# Patient Record
Sex: Female | Born: 1940 | Race: Black or African American | Hispanic: No | Marital: Married | Smoking: Never smoker
Health system: Southern US, Community
[De-identification: ages and names within clinical notes are randomized; demographics above are authoritative.]

## PROBLEM LIST (undated history)

## (undated) DIAGNOSIS — E119 Type 2 diabetes mellitus without complications: Secondary | ICD-10-CM

## (undated) DIAGNOSIS — I10 Essential (primary) hypertension: Secondary | ICD-10-CM

---

## 2013-07-04 ENCOUNTER — Other Ambulatory Visit: Payer: Self-pay | Admitting: Orthopedic Surgery

## 2013-07-04 DIAGNOSIS — M25512 Pain in left shoulder: Secondary | ICD-10-CM

## 2013-07-06 ENCOUNTER — Other Ambulatory Visit: Payer: Self-pay

## 2013-07-14 ENCOUNTER — Other Ambulatory Visit: Payer: Self-pay

## 2020-10-12 ENCOUNTER — Emergency Department (HOSPITAL_BASED_OUTPATIENT_CLINIC_OR_DEPARTMENT_OTHER): Payer: Medicare Other

## 2020-10-12 ENCOUNTER — Emergency Department (HOSPITAL_BASED_OUTPATIENT_CLINIC_OR_DEPARTMENT_OTHER)
Admission: EM | Admit: 2020-10-12 | Discharge: 2020-10-12 | Disposition: A | Discharge status: Home / Self Care | Payer: Medicare Other | Attending: Emergency Medicine | Admitting: Emergency Medicine

## 2020-10-12 ENCOUNTER — Encounter (HOSPITAL_BASED_OUTPATIENT_CLINIC_OR_DEPARTMENT_OTHER): Payer: Self-pay

## 2020-10-12 ENCOUNTER — Other Ambulatory Visit: Payer: Self-pay

## 2020-10-12 DIAGNOSIS — E119 Type 2 diabetes mellitus without complications: Secondary | ICD-10-CM | POA: Insufficient documentation

## 2020-10-12 DIAGNOSIS — I10 Essential (primary) hypertension: Secondary | ICD-10-CM | POA: Diagnosis not present

## 2020-10-12 DIAGNOSIS — S4991XA Unspecified injury of right shoulder and upper arm, initial encounter: Secondary | ICD-10-CM | POA: Diagnosis present

## 2020-10-12 DIAGNOSIS — R079 Chest pain, unspecified: Secondary | ICD-10-CM | POA: Diagnosis not present

## 2020-10-12 DIAGNOSIS — S43401A Unspecified sprain of right shoulder joint, initial encounter: Secondary | ICD-10-CM | POA: Diagnosis not present

## 2020-10-12 DIAGNOSIS — M25511 Pain in right shoulder: Secondary | ICD-10-CM | POA: Insufficient documentation

## 2020-10-12 DIAGNOSIS — Y92410 Unspecified street and highway as the place of occurrence of the external cause: Secondary | ICD-10-CM | POA: Insufficient documentation

## 2020-10-12 HISTORY — DX: Type 2 diabetes mellitus without complications: E11.9

## 2020-10-12 HISTORY — DX: Essential (primary) hypertension: I10

## 2020-10-12 MED ORDER — METHOCARBAMOL 500 MG PO TABS: 500.0000 mg | ORAL_TABLET | Freq: Three times a day (TID) | ORAL | 0 refills | Status: DC | PRN
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
## 2020-10-12 NOTE — ED Notes (Signed)
ED Provider at bedside. 

## 2021-04-02 ENCOUNTER — Encounter (HOSPITAL_BASED_OUTPATIENT_CLINIC_OR_DEPARTMENT_OTHER): Payer: Self-pay

## 2021-04-02 ENCOUNTER — Other Ambulatory Visit: Payer: Self-pay

## 2021-04-02 ENCOUNTER — Emergency Department (HOSPITAL_BASED_OUTPATIENT_CLINIC_OR_DEPARTMENT_OTHER)
Admission: EM | Admit: 2021-04-02 | Discharge: 2021-04-02 | Disposition: A | Discharge status: Home / Self Care | Payer: Medicare Other | Attending: Emergency Medicine | Admitting: Emergency Medicine

## 2021-04-02 DIAGNOSIS — E119 Type 2 diabetes mellitus without complications: Secondary | ICD-10-CM | POA: Diagnosis not present

## 2021-04-02 DIAGNOSIS — M542 Cervicalgia: Secondary | ICD-10-CM | POA: Diagnosis present

## 2021-04-02 DIAGNOSIS — I10 Essential (primary) hypertension: Secondary | ICD-10-CM | POA: Insufficient documentation

## 2021-04-02 MED ORDER — CELECOXIB 200 MG PO CAPS: ORAL_CAPSULE | ORAL | 0 refills | Status: AC
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
## 2021-04-02 NOTE — ED Triage Notes (Signed)
Pt c/o bilateral neck pain x 3 days, denies any other symptoms.

## 2021-04-17 ENCOUNTER — Emergency Department (HOSPITAL_BASED_OUTPATIENT_CLINIC_OR_DEPARTMENT_OTHER)
Admission: EM | Admit: 2021-04-17 | Discharge: 2021-04-17 | Disposition: A | Discharge status: Home / Self Care | Payer: Medicare Other | Attending: Emergency Medicine | Admitting: Emergency Medicine

## 2021-04-17 ENCOUNTER — Encounter (HOSPITAL_BASED_OUTPATIENT_CLINIC_OR_DEPARTMENT_OTHER): Payer: Self-pay | Admitting: Emergency Medicine

## 2021-04-17 ENCOUNTER — Emergency Department (HOSPITAL_BASED_OUTPATIENT_CLINIC_OR_DEPARTMENT_OTHER): Payer: Medicare Other

## 2021-04-17 DIAGNOSIS — Z23 Encounter for immunization: Secondary | ICD-10-CM | POA: Insufficient documentation

## 2021-04-17 DIAGNOSIS — W01198A Fall on same level from slipping, tripping and stumbling with subsequent striking against other object, initial encounter: Secondary | ICD-10-CM | POA: Insufficient documentation

## 2021-04-17 DIAGNOSIS — I10 Essential (primary) hypertension: Secondary | ICD-10-CM | POA: Diagnosis not present

## 2021-04-17 DIAGNOSIS — S0181XA Laceration without foreign body of other part of head, initial encounter: Secondary | ICD-10-CM

## 2021-04-17 DIAGNOSIS — S0990XA Unspecified injury of head, initial encounter: Secondary | ICD-10-CM | POA: Diagnosis present

## 2021-04-17 DIAGNOSIS — E119 Type 2 diabetes mellitus without complications: Secondary | ICD-10-CM | POA: Diagnosis not present

## 2021-04-17 MED ORDER — TETANUS-DIPHTH-ACELL PERTUSSIS 5-2.5-18.5 LF-MCG/0.5 IM SUSY
0.5000 mL | PREFILLED_SYRINGE | Freq: Once | INTRAMUSCULAR | Status: AC
  Administered 2021-04-17: 0.5 mL via INTRAMUSCULAR
  Filled 2021-04-17: qty 0.5

## 2021-04-17 NOTE — ED Provider Notes (Signed)
[facility] EMERGENCY DEPARTMENT Provider Note   CSN: [id number] Arrival date & time: 04/17/21  1346     History  Chief Complaint  Patient presents with   Head Laceration    Destiny Castillo is a 81 y.o. female.   Head Laceration   Patient with medical history notable for hypertension and type 2 diabetes presents due to fall.  2 hours prior to arrival patient was walking and tripped over a box hitting her head.  She has superficial laceration over her left eyebrow.  She did not lose consciousness, has not had any headache, vision changes, nausea, vomiting.  Not on any blood thinners.   Home Medications Prior to Admission medications   Medication Sig Start Date End Date Taking? Authorizing Provider  celecoxib (CELEBREX) 200 MG capsule Take 1 tablet twice daily as needed for pain. 04/02/21   Molpus, John, MD      Allergies    Codeine    Review of Systems   Review of Systems  Skin:  Positive for wound.   Physical Exam Updated Vital Signs BP (!) 182/69 (BP Location: Left Arm)    Pulse 69    Temp 98.5 F (36.9 C) (Oral)    Resp (!) 22    SpO2 95%  Physical Exam Vitals and nursing note reviewed. Exam conducted with a chaperone present.  Constitutional:      Appearance: Normal appearance.  HENT:     Head: Normocephalic.     Right Ear: Tympanic membrane normal.     Left Ear: Tympanic membrane normal.     Ears:     Comments: No hemotympanum. Negative battle sign.     Nose: Nose normal. No rhinorrhea.     Comments: No septal hematoma. No nasal crepitus.     Mouth/Throat:     Mouth: Mucous membranes are moist.     Pharynx: Oropharynx is clear.     Comments: No malocclusion or dental fractures. No dental avulsions appreciated.  Eyes:     Extraocular Movements: Extraocular movements intact.     Pupils: Pupils are equal, round, and reactive to light.     Comments: No tear drop pupil or pain with EOMs.  Neck:     Comments: C-spine tenderness but not midline, no  palpable step-offs. Cardiovascular:     Rate and Rhythm: Normal rate and regular rhythm.  Musculoskeletal:     Cervical back: Normal range of motion. Tenderness present.     Comments: Moving all extremities   Neurological:     General: No focal deficit present.     Mental Status: She is alert and oriented to person, place, and time.     Comments: No facial numbness. The patient is alert and oriented to person, place, and time with normal speech. Cranial nerves III-XII are grossly intact.   Psychiatric:        Mood and Affect: Mood normal.    ED Results / Procedures / Treatments   Labs (all labs ordered are listed, but only abnormal results are displayed) Labs Reviewed - No data to display  EKG None  Radiology CT Head Wo Contrast  Result Date: 04/17/2021 CLINICAL DATA:  Head trauma, minor (Age >= 65y); Neck trauma (Age >= 65y) EXAM: CT HEAD WITHOUT CONTRAST CT CERVICAL SPINE WITHOUT CONTRAST TECHNIQUE: Multidetector CT imaging of the head and cervical spine was performed following the standard protocol without intravenous contrast. Multiplanar CT image reconstructions of the cervical spine were also generated. RADIATION DOSE REDUCTION: This exam  was performed according to the departmental dose-optimization program which includes automated exposure control, adjustment of the mA and/or kV according to patient size and/or use of iterative reconstruction technique. COMPARISON:  Head CT 10/10/2020 FINDINGS: CT HEAD FINDINGS Brain: No evidence of acute intracranial hemorrhage or extra-axial collection.No evidence of mass lesion/concern mass effect.The ventricles are normal in size.Scattered subcortical and periventricular white matter hypodensities, nonspecific but likely sequela of chronic small vessel ischemic disease. Vascular: No hyperdense vessel or unexpected calcification. Skull: Normal. Negative for fracture or focal lesion. Sinuses/Orbits: No acute finding. Other: Small laceration along  the left eyebrow. CT CERVICAL SPINE FINDINGS Alignment: Trace degenerative anterolisthesis at C3-C4 and C4-C5. Skull base and vertebrae: No acute fracture. No primary bone lesion or focal pathologic process. Soft tissues and spinal canal: No prevertebral fluid or swelling. No visible canal hematoma. Disc levels: There is multilevel degenerative disc disease, overall mild to moderate, worst at C5-C6 and C6-C7 and C7-T1. There is moderate multilevel facet arthropathy, with bony fusion at C2-C3 and C3-C4. Upper chest: Negative. Other: None. IMPRESSION: No acute intracranial abnormality. Unchanged mild sequela of chronic small vessel ischemic disease. No acute cervical spine fracture. Multilevel degenerative disc disease and facet arthropathy. Electronically Signed   By: Caprice Renshaw M.D.   On: 04/17/2021 16:09   CT Cervical Spine Wo Contrast  Result Date: 04/17/2021 CLINICAL DATA:  Head trauma, minor (Age >= 65y); Neck trauma (Age >= 65y) EXAM: CT HEAD WITHOUT CONTRAST CT CERVICAL SPINE WITHOUT CONTRAST TECHNIQUE: Multidetector CT imaging of the head and cervical spine was performed following the standard protocol without intravenous contrast. Multiplanar CT image reconstructions of the cervical spine were also generated. RADIATION DOSE REDUCTION: This exam was performed according to the departmental dose-optimization program which includes automated exposure control, adjustment of the mA and/or kV according to patient size and/or use of iterative reconstruction technique. COMPARISON:  Head CT 10/10/2020 FINDINGS: CT HEAD FINDINGS Brain: No evidence of acute intracranial hemorrhage or extra-axial collection.No evidence of mass lesion/concern mass effect.The ventricles are normal in size.Scattered subcortical and periventricular white matter hypodensities, nonspecific but likely sequela of chronic small vessel ischemic disease. Vascular: No hyperdense vessel or unexpected calcification. Skull: Normal. Negative for  fracture or focal lesion. Sinuses/Orbits: No acute finding. Other: Small laceration along the left eyebrow. CT CERVICAL SPINE FINDINGS Alignment: Trace degenerative anterolisthesis at C3-C4 and C4-C5. Skull base and vertebrae: No acute fracture. No primary bone lesion or focal pathologic process. Soft tissues and spinal canal: No prevertebral fluid or swelling. No visible canal hematoma. Disc levels: There is multilevel degenerative disc disease, overall mild to moderate, worst at C5-C6 and C6-C7 and C7-T1. There is moderate multilevel facet arthropathy, with bony fusion at C2-C3 and C3-C4. Upper chest: Negative. Other: None. IMPRESSION: No acute intracranial abnormality. Unchanged mild sequela of chronic small vessel ischemic disease. No acute cervical spine fracture. Multilevel degenerative disc disease and facet arthropathy. Electronically Signed   By: Caprice Renshaw M.D.   On: 04/17/2021 16:09    Procedures Procedures    Medications Ordered in ED Medications  Tdap (BOOSTRIX) injection 0.5 mL (0.5 mLs Intramuscular Given 04/17/21 1552)    ED Course/ Medical Decision Making/ A&P                           Medical Decision Making Amount and/or Complexity of Data Reviewed Radiology: ordered.  Risk Prescription drug management.   Patient presents with forehead laceration.  Differential diagnosis includes but is not  limited to forehead laceration, intracranial bleed, C-spine injury, basilar skull fracture.   She is not anticoagulated and has no focal deficits on exam.  GCS 15.  No findings that would support basilar skull fracture.  I reviewed patient's chart, her next test is tetanus booster scheduled for November of this year, will update today in the ED.  Discussed laceration repair with sutures versus Dermabond. Patient states she does not want needles and is not worried about the cosmetic result.  I believe it is reasonable to proceed with Dermabond repair.  CT head ordered given patient's  risk for more severe brain injuries secondary to age.  I reviewed the imaging and independently interpreted there were no signs of intracranial hemorrhage or skull fracture.  Considered CT of cervical spine but based on [place] C-spine rule do not think necessary.  Patient is in agreement.  Reevaluation patient is unchanged, reports feeling well.   Dx - forehead laceration   I feel she is appropriate for discharge home and outpatient follow-up as needed.  No indications for antibiotics.         Final Clinical Impression(s) / ED Diagnoses Final diagnoses:  Laceration of forehead, initial encounter    Rx / DC Orders ED Discharge Orders     None         Theron Arista, PA-C 04/17/21 2352    Charlynne Pander, MD 04/18/21 [phone]

## 2021-04-17 NOTE — ED Triage Notes (Addendum)
Pt report she tripped over a box this afternoon and hit her head. Pt has 1" laceration over L eyebrow. No bleeding at present. Not on blood thinners. No LOC.

## 2021-04-17 NOTE — Discharge Instructions (Signed)
The CT imaging of your head and neck look normal.  Take Tylenol as needed for pain at home.  The Dermabond will dissolve on its own, keep dry for the next 24 hours.  Your tetanus was updated in the ED today, you will not need booster for 7 to 10 years.  Please let your primary know as you are currently scheduled for update in November of this year.

## 2021-04-17 NOTE — ED Notes (Signed)
Client alert and oriented, follows commands without hesitation, no changes in LOC at this time. Laceration at left eye lid noted, no active bleeding is noted. Tetanus IM administered. Pt teaching provided

## 2021-04-25 ENCOUNTER — Emergency Department (HOSPITAL_BASED_OUTPATIENT_CLINIC_OR_DEPARTMENT_OTHER): Payer: Medicare Other

## 2021-04-25 ENCOUNTER — Encounter (HOSPITAL_BASED_OUTPATIENT_CLINIC_OR_DEPARTMENT_OTHER): Payer: Self-pay

## 2021-04-25 ENCOUNTER — Emergency Department (HOSPITAL_BASED_OUTPATIENT_CLINIC_OR_DEPARTMENT_OTHER)
Admission: EM | Admit: 2021-04-25 | Discharge: 2021-04-25 | Disposition: A | Discharge status: Home / Self Care | Payer: Medicare Other | Attending: Emergency Medicine | Admitting: Emergency Medicine

## 2021-04-25 ENCOUNTER — Other Ambulatory Visit: Payer: Self-pay

## 2021-04-25 DIAGNOSIS — Z79899 Other long term (current) drug therapy: Secondary | ICD-10-CM | POA: Diagnosis not present

## 2021-04-25 DIAGNOSIS — I10 Essential (primary) hypertension: Secondary | ICD-10-CM | POA: Diagnosis not present

## 2021-04-25 DIAGNOSIS — R079 Chest pain, unspecified: Secondary | ICD-10-CM

## 2021-04-25 DIAGNOSIS — Z7982 Long term (current) use of aspirin: Secondary | ICD-10-CM | POA: Diagnosis not present

## 2021-04-25 DIAGNOSIS — Z7984 Long term (current) use of oral hypoglycemic drugs: Secondary | ICD-10-CM | POA: Diagnosis not present

## 2021-04-25 DIAGNOSIS — E119 Type 2 diabetes mellitus without complications: Secondary | ICD-10-CM | POA: Insufficient documentation

## 2021-04-25 LAB — BASIC METABOLIC PANEL WITH GFR
Anion gap: 8 (ref 5–15)
BUN: 17 mg/dL (ref 8–23)
CO2: 26 mmol/L (ref 22–32)
Calcium: 9.2 mg/dL (ref 8.9–10.3)
Chloride: 104 mmol/L (ref 98–111)
Creatinine, Ser: 0.71 mg/dL (ref 0.44–1.00)
GFR, Estimated: >60 mL/min
Glucose, Bld: 185 mg/dL — ABNORMAL HIGH (ref 70–99)
Potassium: 3.6 mmol/L (ref 3.5–5.1)
Sodium: 138 mmol/L (ref 135–145)

## 2021-04-25 LAB — CBC
HCT: 40.7 % (ref 36.0–46.0)
Hemoglobin: 14.2 g/dL (ref 12.0–15.0)
MCH: 30.3 pg (ref 26.0–34.0)
MCHC: 34.9 g/dL (ref 30.0–36.0)
MCV: 86.8 fL (ref 80.0–100.0)
Platelets: 151 10*3/uL (ref 150–400)
RBC: 4.69 MIL/uL (ref 3.87–5.11)
RDW: 13.3 % (ref 11.5–15.5)
WBC: 4 10*3/uL (ref 4.0–10.5)
nRBC: 0 % (ref 0.0–0.2)

## 2021-04-25 LAB — TROPONIN I (HIGH SENSITIVITY)
Troponin I (High Sensitivity): 3 ng/L (ref <18)
Troponin I (High Sensitivity): 3 ng/L

## 2021-04-25 NOTE — Discharge Instructions (Signed)
Please follow-up with your primary doctor to discuss your episode from earlier today.  If you have recurrent chest pain, difficulty breathing or any concerning symptom, come back to ER for reassessment.

## 2021-04-25 NOTE — ED Triage Notes (Signed)
Pt reports CP that began earlier today . Pain has worsened since approx this afternoon

## 2021-04-25 NOTE — ED Notes (Signed)
ED Provider at bedside. 

## 2021-04-25 NOTE — ED Provider Notes (Signed)
[facility] EMERGENCY DEPARTMENT Provider Note   CSN: [id number] Arrival date & time: 04/25/21  1810     History  Chief Complaint  Patient presents with   Chest Pain    Destiny Castillo is a 81 y.o. female.  Presenting to the emergency department with concern for chest pain.  Reports that around dinnertime tonight she started having central sharp, aching chest pain.  Nonradiating.  Lasted for an hour or so and then seem to resolve.  Moderate in severity at the time but now has completely resolved.  Denies any ongoing pain.  No associated difficulty in breathing.  No abdominal pain or vomiting.  No chills or fevers, no recent cough.  Episode was not associated with any exertion.  Reviewed last PCP note 06 April 2021 - patient has a history of diabetes, hypertension, hyperlipidemia  HPI     Home Medications Prior to Admission medications   Medication Sig Start Date End Date Taking? Authorizing Provider  amLODipine (NORVASC) 5 MG tablet Take 1 tablet by mouth daily. 09/21/16  Yes [provider]  aspirin 81 MG EC tablet Take 1 tablet by mouth daily. 04/28/13  Yes [provider]  metFORMIN (GLUCOPHAGE-XR) 500 MG 24 hr tablet Take by mouth. 09/14/20  Yes [provider]  celecoxib (CELEBREX) 200 MG capsule Take 1 tablet twice daily as needed for pain. 04/02/21   Molpus, John, MD  metoprolol succinate (TOPROL-XL) 100 MG 24 hr tablet Take 100 mg by mouth 2 (two) times daily. 03/18/21   [provider]  omeprazole (PRILOSEC) 20 MG capsule Take 20 mg by mouth daily. 04/04/21   [provider]  pioglitazone (ACTOS) 15 MG tablet Take 15 mg by mouth daily. 04/13/21   [provider]  pravastatin (PRAVACHOL) 80 MG tablet Take 80 mg by mouth daily. 04/05/21   [provider]  telmisartan (MICARDIS) 80 MG tablet Take 80 mg by mouth daily. 04/04/21   [provider]      Allergies    Codeine    Review of Systems   Review of  Systems  Constitutional:  Negative for chills and fever.  HENT:  Negative for ear pain and sore throat.   Eyes:  Negative for pain and visual disturbance.  Respiratory:  Negative for cough and shortness of breath.   Cardiovascular:  Positive for chest pain. Negative for palpitations.  Gastrointestinal:  Negative for abdominal pain and vomiting.  Genitourinary:  Negative for dysuria and hematuria.  Musculoskeletal:  Negative for arthralgias and back pain.  Skin:  Negative for color change and rash.  Neurological:  Negative for seizures and syncope.  All other systems reviewed and are negative.  Physical Exam Updated Vital Signs BP (!) 177/92 (BP Location: Left Arm)    Pulse 66    Temp 98.4 F (36.9 C) (Oral)    Resp 18    Ht 5\' 4"  (1.626 m)    Wt 65.3 kg    SpO2 98%    BMI 24.72 kg/m  Physical Exam Vitals and nursing note reviewed.  Constitutional:      General: She is not in acute distress.    Appearance: She is well-developed.  HENT:     Head: Normocephalic and atraumatic.  Eyes:     Conjunctiva/sclera: Conjunctivae normal.  Cardiovascular:     Rate and Rhythm: Normal rate and regular rhythm.     Heart sounds: No murmur heard. Pulmonary:     Effort: Pulmonary effort is normal. No respiratory  distress.     Breath sounds: Normal breath sounds.  Abdominal:     Palpations: Abdomen is soft.     Tenderness: There is no abdominal tenderness.  Musculoskeletal:        General: No swelling.     Cervical back: Neck supple.  Skin:    General: Skin is warm and dry.     Capillary Refill: Capillary refill takes less than 2 seconds.  Neurological:     Mental Status: She is alert.  Psychiatric:        Mood and Affect: Mood normal.    ED Results / Procedures / Treatments   Labs (all labs ordered are listed, but only abnormal results are displayed) Labs Reviewed  BASIC METABOLIC PANEL - Abnormal; Notable for the following components:      Result Value   Glucose, Bld 185 (*)     All other components within normal limits  CBC  TROPONIN I (HIGH SENSITIVITY)  TROPONIN I (HIGH SENSITIVITY)    EKG EKG Interpretation  Date/Time:  Monday April 25 2021 18:18:03 EST Ventricular Rate:  79 PR Interval:  176 QRS Duration: 74 QT Interval:  378 QTC Calculation: 433 R Axis:   59 Text Interpretation: Normal sinus rhythm Normal ECG No previous ECGs available Confirmed by Madalyn Rob [phone]) on 04/25/2021 9:05:42 PM  Radiology DG Chest 2 View  Result Date: 04/25/2021 CLINICAL DATA:  Chest pain. EXAM: CHEST - 2 VIEW COMPARISON:  September 03, 2020 FINDINGS: The heart size and mediastinal contours are within normal limits. Both lungs are clear. The visualized skeletal structures are unremarkable. IMPRESSION: No active cardiopulmonary disease. Electronically Signed   By: Dorise Bullion III M.D.   On: 04/25/2021 18:47    Procedures Procedures    Medications Ordered in ED Medications - No data to display  ED Course/ Medical Decision Making/ A&P                           Medical Decision Making Amount and/or Complexity of Data Reviewed Labs: ordered. Radiology: ordered.   81 year old lady with history of hypertension, hyperlipidemia, diabetes presenting to ER after having an episode of chest pain.  On arrival to ER her pain had resolved and she appeared well in no distress.  Vitals were normal except for noted to be hypertensive.  On review of past ER visits, patient is frequently in this range.  EKG without acute ischemic change and troponin x2 is within normal limits, doubt ACS.  CXR independently reviewed, no infiltrate noted.  No edema or cardiomegaly.  No anemia or electrolyte derangement.  Given episode resolved without intervention and reassuring work-up today, feel she can be discharged and follow-up with her primary care doctor about this episode and ongoing management of her blood pressure.  Updated husband throughout stay.   After the discussed management  above, the patient was determined to be safe for discharge.  The patient was in agreement with this plan and all questions regarding their care were answered.  ED return precautions were discussed and the patient will return to the ED with any significant worsening of condition.         Final Clinical Impression(s) / ED Diagnoses Final diagnoses:  Chest pain, unspecified type    Rx / DC Orders ED Discharge Orders     None         Lucrezia Starch, MD 04/26/21 1510

## 2021-04-25 NOTE — ED Notes (Signed)
Pt placed in a gown and hooked up to the monitor with the 5 lead, BP cuff and pulse ox 

## 2021-09-25 ENCOUNTER — Emergency Department (HOSPITAL_BASED_OUTPATIENT_CLINIC_OR_DEPARTMENT_OTHER)
Admission: EM | Admit: 2021-09-25 | Discharge: 2021-09-25 | Disposition: A | Discharge status: Home / Self Care | Payer: Medicare Other | Attending: Emergency Medicine | Admitting: Emergency Medicine

## 2021-09-25 ENCOUNTER — Encounter (HOSPITAL_BASED_OUTPATIENT_CLINIC_OR_DEPARTMENT_OTHER): Payer: Self-pay | Admitting: Emergency Medicine

## 2021-09-25 ENCOUNTER — Other Ambulatory Visit: Payer: Self-pay

## 2021-09-25 DIAGNOSIS — Z7984 Long term (current) use of oral hypoglycemic drugs: Secondary | ICD-10-CM | POA: Insufficient documentation

## 2021-09-25 DIAGNOSIS — M542 Cervicalgia: Secondary | ICD-10-CM | POA: Diagnosis present

## 2021-09-25 DIAGNOSIS — I10 Essential (primary) hypertension: Secondary | ICD-10-CM | POA: Insufficient documentation

## 2021-09-25 DIAGNOSIS — E119 Type 2 diabetes mellitus without complications: Secondary | ICD-10-CM | POA: Diagnosis not present

## 2021-09-25 DIAGNOSIS — Z79899 Other long term (current) drug therapy: Secondary | ICD-10-CM | POA: Diagnosis not present

## 2021-09-25 DIAGNOSIS — Z7982 Long term (current) use of aspirin: Secondary | ICD-10-CM | POA: Diagnosis not present

## 2021-09-25 MED ORDER — DICLOFENAC SODIUM 1 % EX GEL
2.0000 g | Freq: Once | CUTANEOUS | Status: DC
  Filled 2021-09-25: qty 100

## 2021-09-25 NOTE — ED Triage Notes (Signed)
Pt reports sore throat since waking up this am. She states she used some hot towels with slight relief. Pt reports old neck injury that she says she was told could affect her this way. Clear speech, able to manage secretions.

## 2021-09-25 NOTE — ED Provider Notes (Signed)
[facility] EMERGENCY DEPARTMENT Provider Note   CSN: [id number] Arrival date & time: 09/25/21  4098     History  Chief Complaint  Patient presents with   Sore Throat    Destiny Castillo is a 81 y.o. female.  The history is provided by the patient, medical records and the spouse.  Sore Throat  Destiny Castillo is a 81 y.o. female who presents to the Emergency Department complaining of neck pain.  She presents to the emergency department accompanied by her husband for evaluation of left anterior neck pain that started about 1 hour ago.  She describes it as a soreness and discomfort that she states is not truly in her throat or in her skin but somewhere in between.  She tried hot towels with partial improvement in her symptoms.  She reports 1 year ago having a neck injury and she was told she could have symptoms like this from it.  No difficulty swallowing, pain on swallowing, shortness of breath, nausea, chest pain, numbness, weakness.  No prior similar symptoms.  She has a history of diabetes, hypertension.  No history of coronary artery disease     Home Medications Prior to Admission medications   Medication Sig Start Date End Date Taking? Authorizing Provider  amLODipine (NORVASC) 5 MG tablet Take 1 tablet by mouth daily. 09/21/16   [provider]  aspirin 81 MG EC tablet Take 1 tablet by mouth daily. 04/28/13   [provider]  celecoxib (CELEBREX) 200 MG capsule Take 1 tablet twice daily as needed for pain. 04/02/21   Molpus, John, MD  metFORMIN (GLUCOPHAGE-XR) 500 MG 24 hr tablet Take by mouth. 09/14/20   [provider]  metoprolol succinate (TOPROL-XL) 100 MG 24 hr tablet Take 100 mg by mouth 2 (two) times daily. 03/18/21   [provider]  omeprazole (PRILOSEC) 20 MG capsule Take 20 mg by mouth daily. 04/04/21   [provider]  pioglitazone (ACTOS) 15 MG tablet Take 15 mg by mouth daily. 04/13/21   [provider]   pravastatin (PRAVACHOL) 80 MG tablet Take 80 mg by mouth daily. 04/05/21   [provider]  telmisartan (MICARDIS) 80 MG tablet Take 80 mg by mouth daily. 04/04/21   [provider]      Allergies    Codeine    Review of Systems   Review of Systems  All other systems reviewed and are negative.   Physical Exam Updated Vital Signs BP (!) 171/68 (BP Location: Right Arm)   Pulse 70   Temp 98.5 F (36.9 C) (Oral)   Resp 16   Ht 5\' 4"  (1.626 m)   Wt 66.2 kg   SpO2 98%   BMI 25.06 kg/m  Physical Exam Vitals and nursing note reviewed.  Constitutional:      Appearance: She is well-developed.  HENT:     Head: Normocephalic and atraumatic.     Nose: Nose normal.     Mouth/Throat:     Mouth: Mucous membranes are moist.     Pharynx: No posterior oropharyngeal erythema.  Neck:     Comments: No thyromegaly Cardiovascular:     Rate and Rhythm: Normal rate and regular rhythm.     Heart sounds: No murmur heard. Pulmonary:     Effort: Pulmonary effort is normal. No respiratory distress.     Breath sounds: Normal breath sounds.  Abdominal:     Palpations: Abdomen is soft.     Tenderness: There is no abdominal tenderness.  There is no guarding or rebound.  Musculoskeletal:        General: No tenderness.     Cervical back: Neck supple.  Lymphadenopathy:     Cervical: No cervical adenopathy.  Skin:    General: Skin is warm and dry.  Neurological:     Mental Status: She is alert and oriented to person, place, and time.     Comments: 5 out of 5 strength in all 4 extremities with sensation to light touch intact in all 4 extremities  Psychiatric:        Behavior: Behavior normal.     ED Results / Procedures / Treatments   Labs (all labs ordered are listed, but only abnormal results are displayed) Labs Reviewed - No data to display  EKG EKG Interpretation  Date/Time:  Sunday September 25 2021 05:53:33 EDT Ventricular Rate:  71 PR Interval:  176 QRS  Duration: 95 QT Interval:  406 QTC Calculation: 442 R Axis:   52 Text Interpretation: Sinus rhythm Low voltage, precordial leads Confirmed by Tilden Fossa [phone]) on 09/25/2021 6:03:56 AM  Radiology No results found.  Procedures Procedures    Medications Ordered in ED Medications  diclofenac Sodium (VOLTAREN) 1 % topical gel 2 g (2 g Topical Patient Refused/Not Given 09/25/21 0604)    ED Course/ Medical Decision Making/ A&P                           Medical Decision Making  Patient here for evaluation of atraumatic neck pain on the left anterior neck.  No evidence of soft tissue infection or mass.  She is neurologically intact on evaluation.  EKG without acute ischemic changes and no cardiac symptoms.  No evidence of deep tissue space infection.  Discussed with patient unclear source of symptoms.  Discussed supportive care at home with outpatient follow-up and return precautions.        Final Clinical Impression(s) / ED Diagnoses Final diagnoses:  Neck discomfort    Rx / DC Orders ED Discharge Orders     None         Tilden Fossa, MD 09/25/21 [id number]

## 2021-09-25 NOTE — Discharge Instructions (Signed)
The cause of your symptoms was not identified today.  You can take Tylenol or ibuprofen, available over-the-counter according to label instructions as needed for pain.  You may also apply warm compresses.  Get rechecked immediately if you develop chest pain, fevers, swelling of your neck, difficulty breathing or difficulty swallowing.

## 2021-11-24 ENCOUNTER — Emergency Department (HOSPITAL_BASED_OUTPATIENT_CLINIC_OR_DEPARTMENT_OTHER)
Admission: EM | Admit: 2021-11-24 | Discharge: 2021-11-24 | Disposition: A | Discharge status: Home / Self Care | Payer: Medicare Other | Attending: Emergency Medicine | Admitting: Emergency Medicine

## 2021-11-24 ENCOUNTER — Encounter (HOSPITAL_BASED_OUTPATIENT_CLINIC_OR_DEPARTMENT_OTHER): Payer: Self-pay | Admitting: Emergency Medicine

## 2021-11-24 ENCOUNTER — Other Ambulatory Visit: Payer: Self-pay

## 2021-11-24 DIAGNOSIS — L0211 Cutaneous abscess of neck: Secondary | ICD-10-CM | POA: Insufficient documentation

## 2021-11-24 DIAGNOSIS — M542 Cervicalgia: Secondary | ICD-10-CM | POA: Diagnosis present

## 2021-11-24 MED ORDER — ACETAMINOPHEN 500 MG PO TABS
1000.0000 mg | ORAL_TABLET | Freq: Once | ORAL | Status: AC
  Administered 2021-11-24: 1000 mg via ORAL
  Filled 2021-11-24: qty 2

## 2021-11-24 NOTE — ED Provider Notes (Signed)
[facility] EMERGENCY DEPARTMENT Provider Note   CSN: [id number] Arrival date & time: 11/24/21  0330     History  Chief Complaint  Patient presents with   Abscess    Destiny Castillo is a 81 y.o. female.  The history is provided by the patient.  Illness Location:  Left neck, insertion of the left sternoclidomastoid Quality:  Pain and the patient feels it looks swollen Severity:  Moderate Onset quality:  Sudden Duration:  6 hours Timing:  Constant Progression:  Unchanged Chronicity:  New Context:  None Relieved by:  Nothing Worsened by:  Movement and palpation Ineffective treatments:  None tried Associated symptoms: no chest pain, no cough, no fever, no headaches, no loss of consciousness, no rash, no rhinorrhea, no shortness of breath, no sore throat, no vomiting and no wheezing   Patient with a h/o neck pain presents with neck pain and feeling left neck is prominent since showering this evening.  Pain with movement and palpation.  No medication taken.       Home Medications Prior to Admission medications   Medication Sig Start Date End Date Taking? Authorizing Provider  amLODipine (NORVASC) 5 MG tablet Take 1 tablet by mouth daily. 09/21/16   [provider]  aspirin 81 MG EC tablet Take 1 tablet by mouth daily. 04/28/13   [provider]  celecoxib (CELEBREX) 200 MG capsule Take 1 tablet twice daily as needed for pain. 04/02/21   Molpus, John, MD  metFORMIN (GLUCOPHAGE-XR) 500 MG 24 hr tablet Take by mouth. 09/14/20   [provider]  metoprolol succinate (TOPROL-XL) 100 MG 24 hr tablet Take 100 mg by mouth 2 (two) times daily. 03/18/21   [provider]  omeprazole (PRILOSEC) 20 MG capsule Take 20 mg by mouth daily. 04/04/21   [provider]  pioglitazone (ACTOS) 15 MG tablet Take 15 mg by mouth daily. 04/13/21   [provider]  pravastatin (PRAVACHOL) 80 MG tablet Take 80 mg by mouth daily. 04/05/21   [provider]  telmisartan (MICARDIS) 80 MG tablet Take 80 mg by mouth daily. 04/04/21   [provider]      Allergies    Codeine    Review of Systems   Review of Systems  Constitutional:  Negative for fever.  HENT:  Negative for rhinorrhea and sore throat.   Respiratory:  Negative for cough, shortness of breath and wheezing.   Cardiovascular:  Negative for chest pain.  Gastrointestinal:  Negative for vomiting.  Musculoskeletal:  Positive for neck pain. Negative for neck stiffness.  Skin:  Negative for rash.  Neurological:  Negative for loss of consciousness and headaches.  All other systems reviewed and are negative.   Physical Exam Updated Vital Signs BP (!) 171/62 (BP Location: Right Arm)   Pulse (!) 57   Temp 99 F (37.2 C) (Oral)   Resp 18   Ht 5\' 4"  (1.626 m)   Wt 66.7 kg   SpO2 100%   BMI 25.23 kg/m  Physical Exam Vitals and nursing note reviewed.  Constitutional:      General: She is not in acute distress.    Appearance: Normal appearance. She is well-developed.  HENT:     Head: Normocephalic and atraumatic.     Nose: Nose normal.  Eyes:     Pupils: Pupils are equal, round, and reactive to light.  Neck:     Comments: Pain with palpation on the L sternoclidomastoid Cardiovascular:     Rate and  Rhythm: Normal rate and regular rhythm.     Pulses: Normal pulses.     Heart sounds: Normal heart sounds.  Pulmonary:     Effort: Pulmonary effort is normal. No respiratory distress.     Breath sounds: Normal breath sounds.  Abdominal:     General: Bowel sounds are normal. There is no distension.     Palpations: Abdomen is soft.     Tenderness: There is no abdominal tenderness. There is no guarding or rebound.  Genitourinary:    Vagina: No vaginal discharge.  Musculoskeletal:        General: Normal range of motion.     Cervical back: Normal range of motion and neck supple. No rigidity.  Lymphadenopathy:     Cervical: No cervical adenopathy.  Skin:     General: Skin is dry.     Capillary Refill: Capillary refill takes less than 2 seconds.     Findings: No erythema or rash.  Neurological:     General: No focal deficit present.     Mental Status: She is alert and oriented to person, place, and time.     Deep Tendon Reflexes: Reflexes normal.  Psychiatric:        Mood and Affect: Mood normal.        Behavior: Behavior normal.     ED Results / Procedures / Treatments   Labs (all labs ordered are listed, but only abnormal results are displayed) Labs Reviewed - No data to display  EKG None  Radiology No results found.  Procedures Procedures    Medications Ordered in ED Medications  acetaminophen (TYLENOL) tablet 1,000 mg (has no administration in time range)    ED Course/ Medical Decision Making/ A&P                           Medical Decision Making Patient with pain in the Left SCM.    Amount and/or Complexity of Data Reviewed Independent Historian: spouse    Details: See above  External Data Reviewed: notes.    Details: Previous ED notes reviewed   Risk OTC drugs. Risk Details: MSK pain. Alternate tylenol and ibuprofen.  Ice the affected area.  Follow up with your PMD.  Strict return.      Final Clinical Impression(s) / ED Diagnoses Final diagnoses:  Neck pain   Return for intractable cough, coughing up blood, fevers > 100.4 unrelieved by medication, shortness of breath, intractable vomiting, chest pain, shortness of breath, weakness, numbness, changes in speech, facial asymmetry, abdominal pain, passing out, Inability to tolerate liquids or food, cough, altered mental status or any concerns. No signs of systemic illness or infection. The patient is nontoxic-appearing on exam and vital signs are within normal limits.  I have reviewed the triage vital signs and the nursing notes. Pertinent labs & imaging results that were available during my care of the patient were reviewed by me and considered in my medical  decision making (see chart for details). After history, exam, and medical workup I feel the patient has been appropriately medically screened and is safe for discharge home. Pertinent diagnoses were discussed with the patient. Patient was given return precautions.  Rx / DC Orders ED Discharge Orders     None         Jalayia Bagheri, MD 11/24/21 [phone]

## 2021-11-24 NOTE — ED Triage Notes (Signed)
Pt "discovered a Knott" on the back of neck yesterday .states is painfully.

## 2022-01-22 IMAGING — CT CT SHOULDER*R* W/O CM
3 series · 13 of 33 positions shown, 16 images · non-contrast
Comparison: Right shoulder radiograph 10/10/2020

CLINICAL DATA: Shoulder trauma, fracture of humerus or scapula

EXAM:
CT OF THE UPPER RIGHT EXTREMITY WITHOUT CONTRAST
TECHNIQUE: Multidetector CT imaging of the upper right extremity was performed
according to the standard protocol.

[Series 5: ax st · axial · 0.41mm/px · z∈[-152,-30]mm · 5 of 90 slices shown, 7 images]
[im 14/90  soft-tissue]
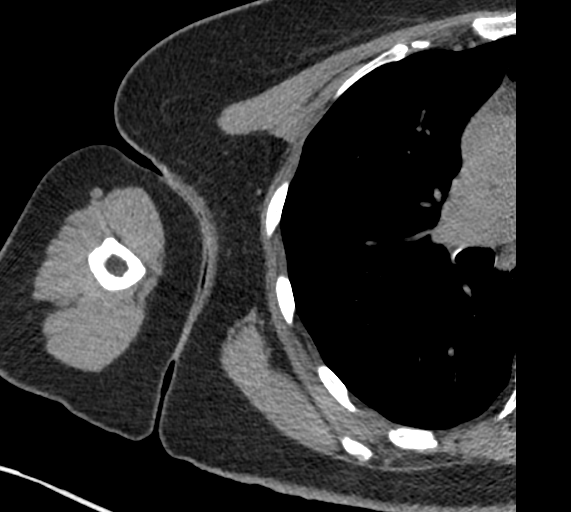
[im 14/90  bone]
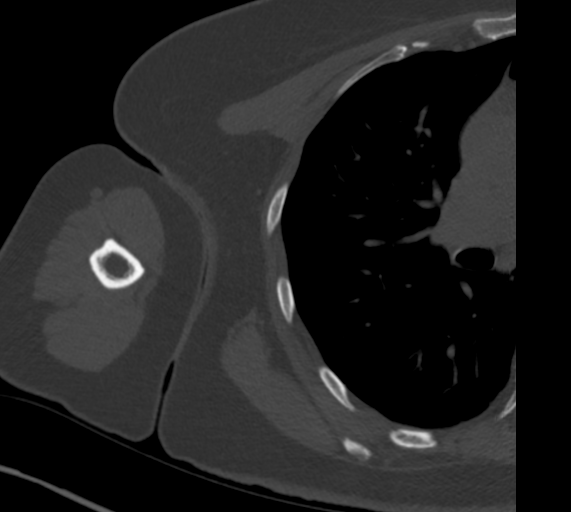
[im 28/90  bone]
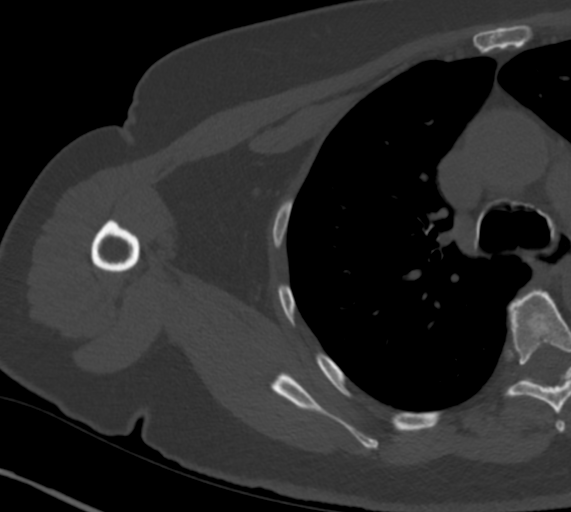
[im 48/90  bone]
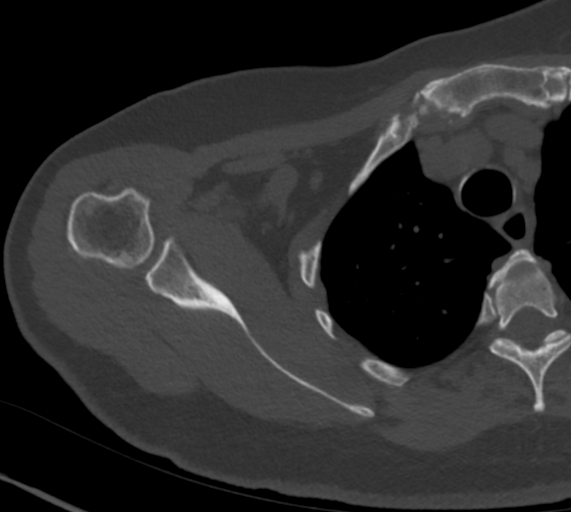
[im 62/90  bone]
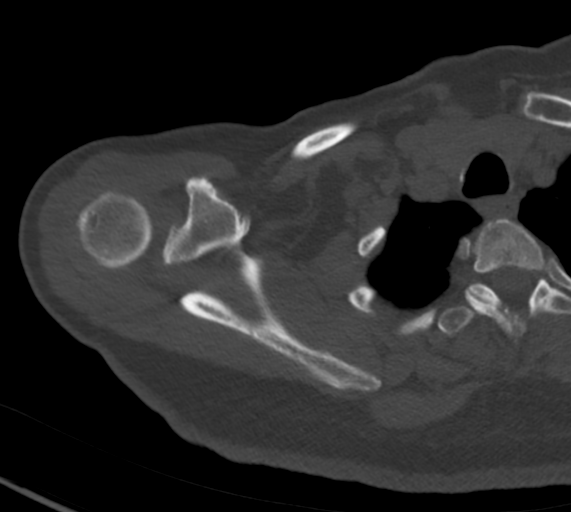
[im 76/90  soft-tissue]
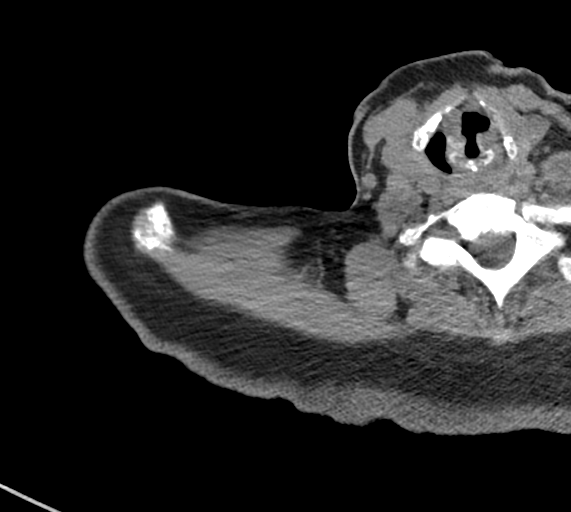
[im 76/90  bone]
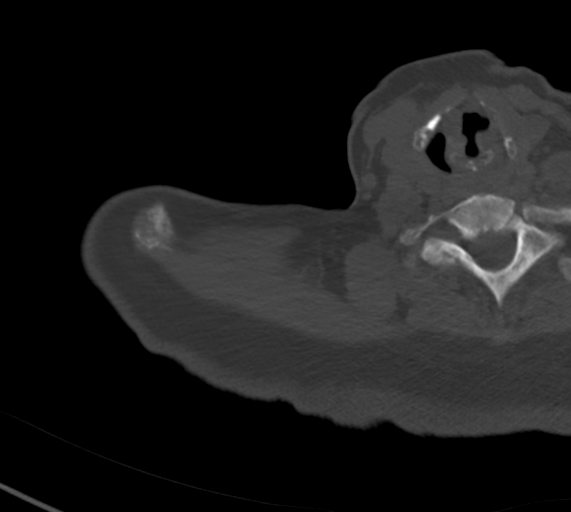

[Series 8: cor st · coronal · 0.35mm/px · 3 of 103 slices shown]
[im 21/103  bone]
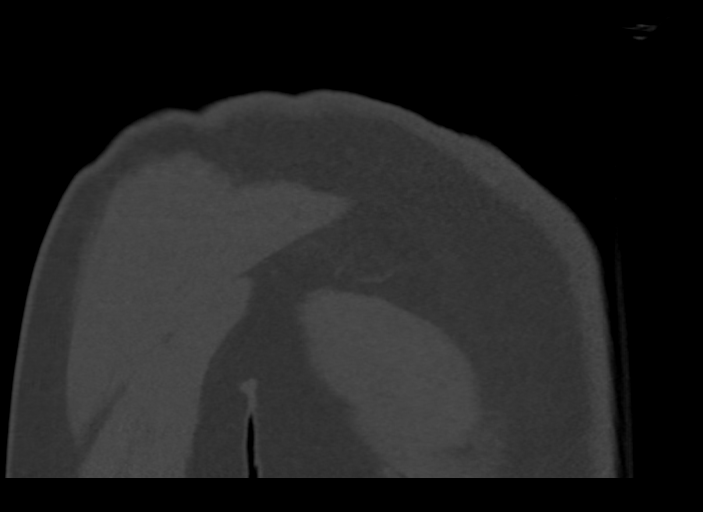
[im 41/103  bone]
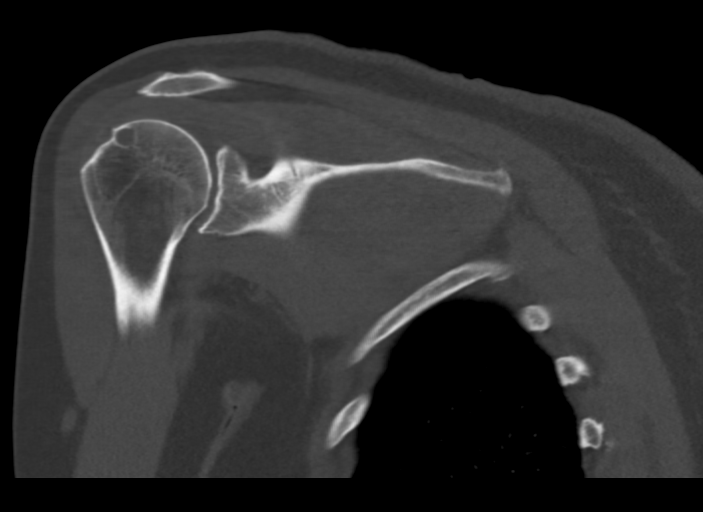
[im 62/103  bone]
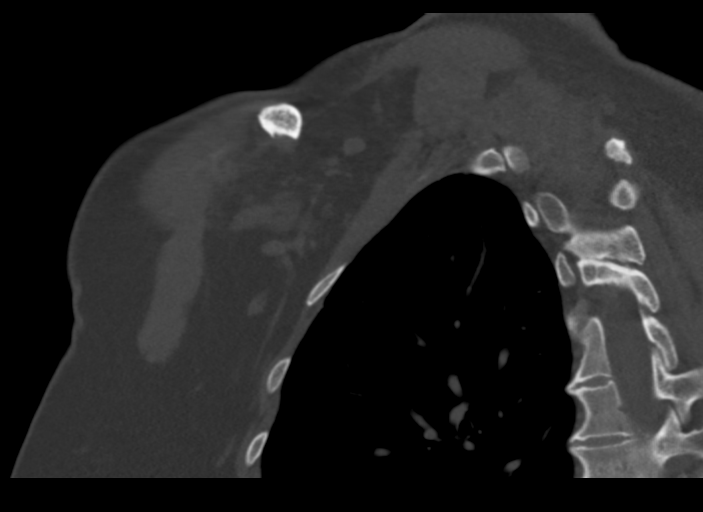

[Series 9: sag st · sagittal · 0.32mm/px · 5 of 118 slices shown, 6 images]
[im 40/118  bone]
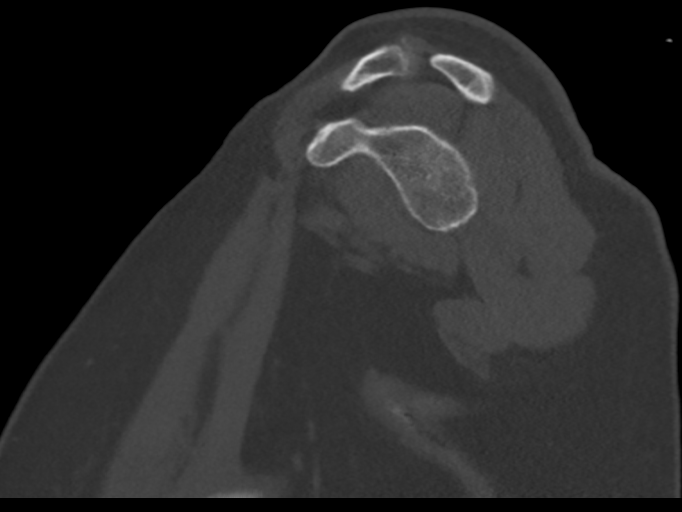
[im 49/118  bone]
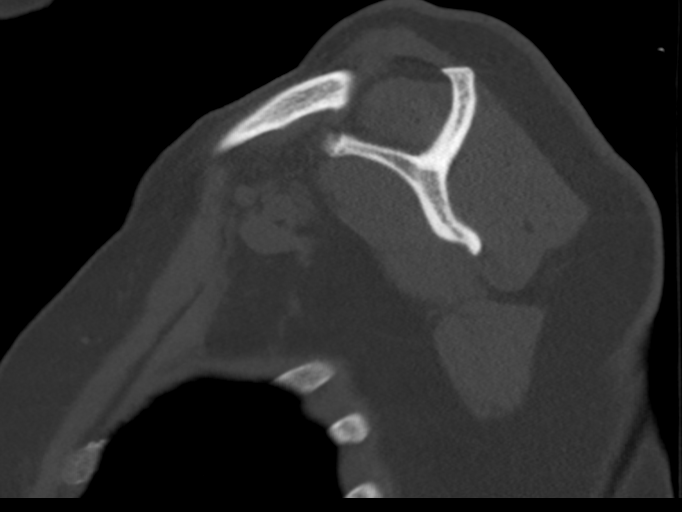
[im 59/118  soft-tissue]
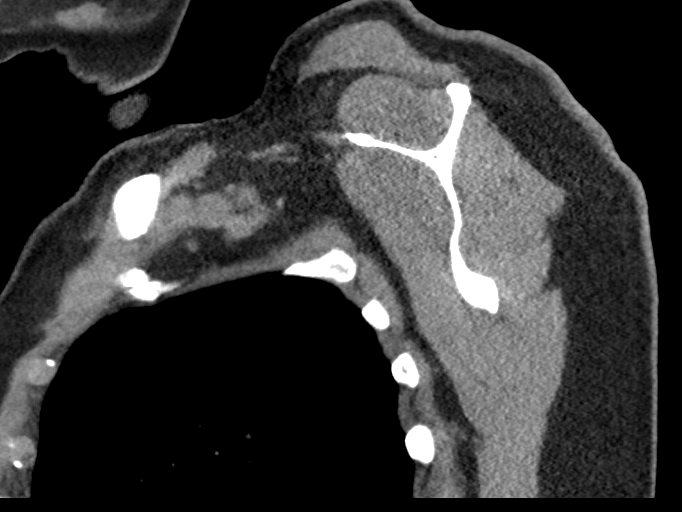
[im 59/118  bone]
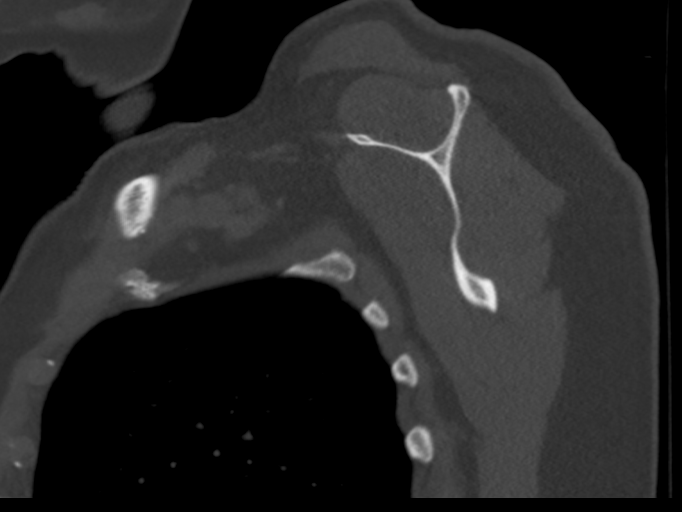
[im 69/118  bone]
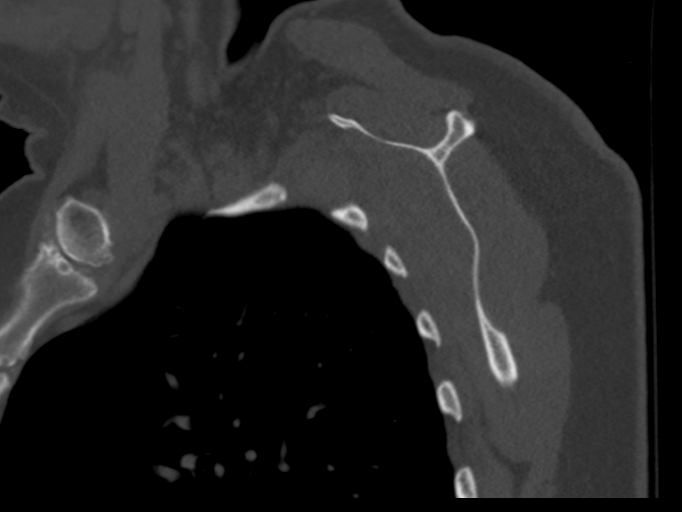
[im 79/118  bone]
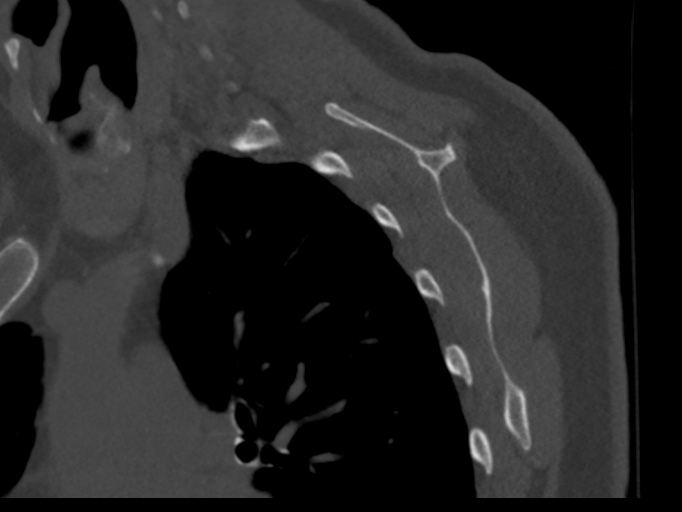

[13 of 33 positions shown; findings below may reference images not displayed]

FINDINGS: Bones/Joint/Cartilage

There is no acute fracture or dislocation. There is mild
glenohumeral and moderate AC joint degenerative change.

Ligaments

Suboptimally assessed by CT.

Muscles and Tendons

No muscle atrophy. Minimal mineralization along the distal teres
minor/inferior infraspinatus distally.

Soft tissues

No focal fluid collection.
IMPRESSION: No acute fracture or dislocation.

Mild glenohumeral and moderate AC joint degenerative change.

## 2022-07-28 IMAGING — CT CT CERVICAL SPINE W/O CM
3 of 4 series · 13 of 33 positions shown, 16 images · non-contrast
Comparison: Head CT 10/10/2020

CLINICAL DATA: Head trauma, minor (Age >= 65y); Neck trauma (Age >=
65y)



[Series 3: c_spine 2.0 i30s 3 · axial · 0.32mm/px · z∈[-560,-472]mm · 5 of 66 slices shown, 7 images]
[im 11/66  soft-tissue]
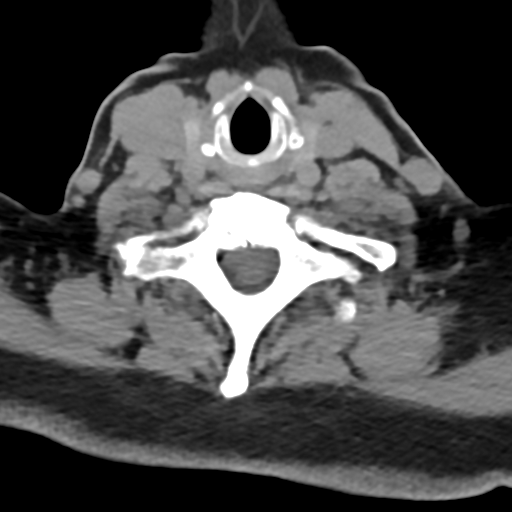
[im 11/66  bone]
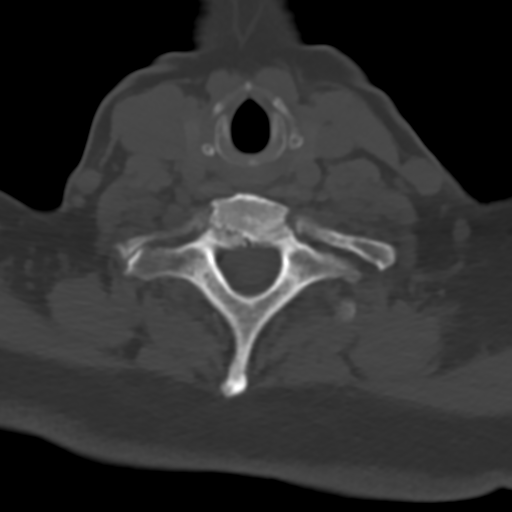
[im 22/66  bone]
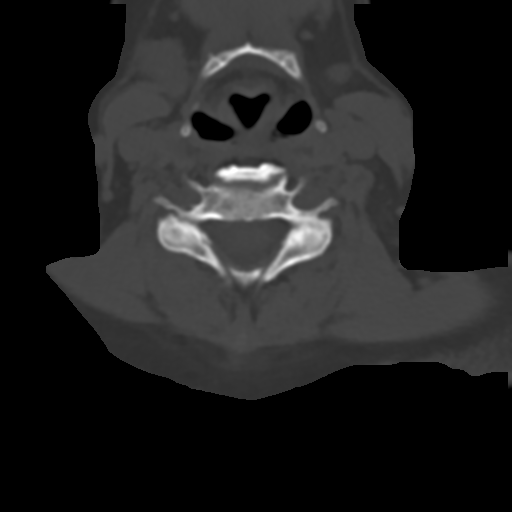
[im 33/66  bone]
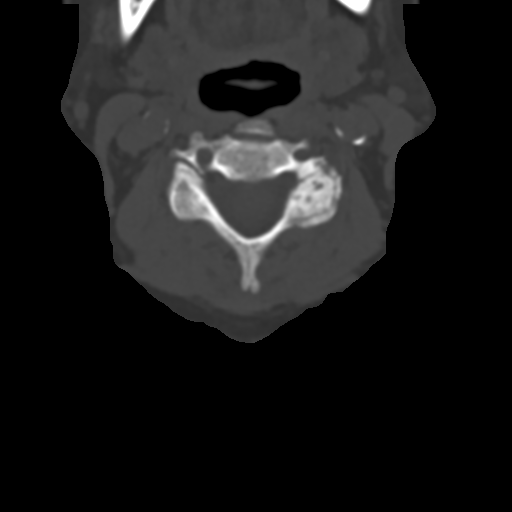
[im 44/66  bone]
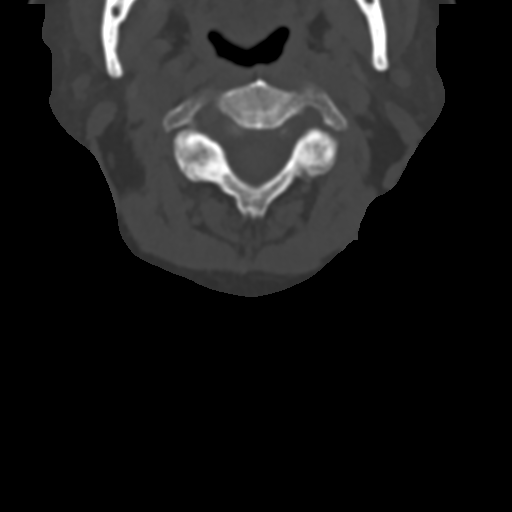
[im 55/66  soft-tissue]
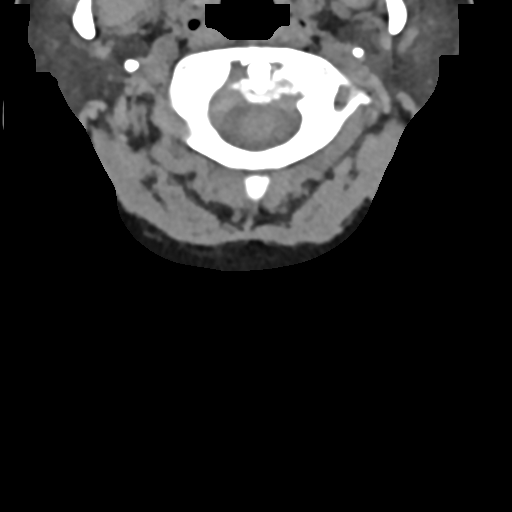
[im 55/66  bone]
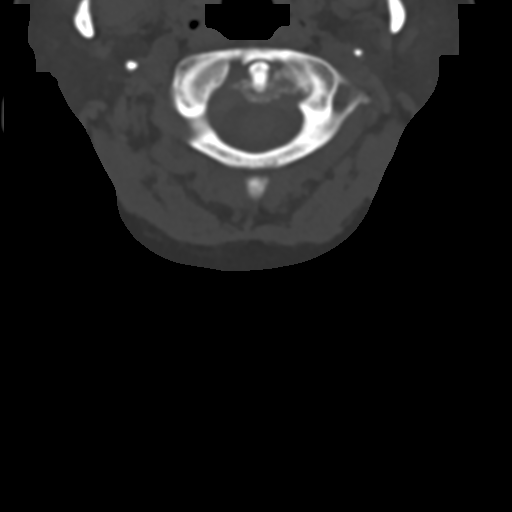

[Series 5: coronals · coronal · 0.23mm/px · 3 of 39 slices shown]
[im 8/39  bone]
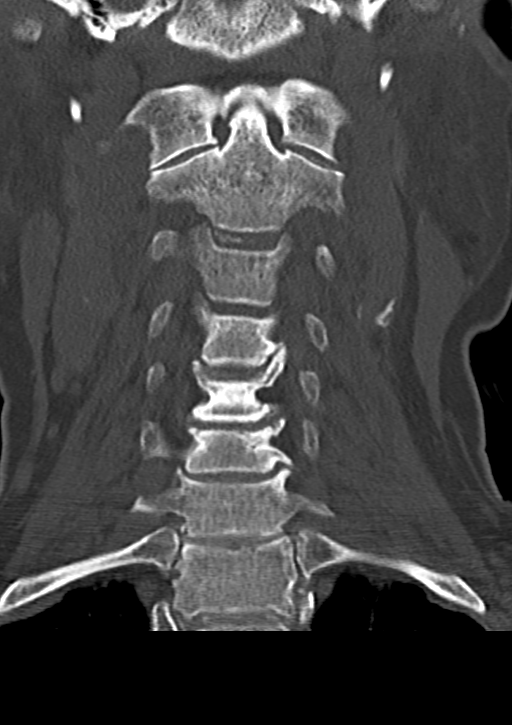
[im 16/39  bone]
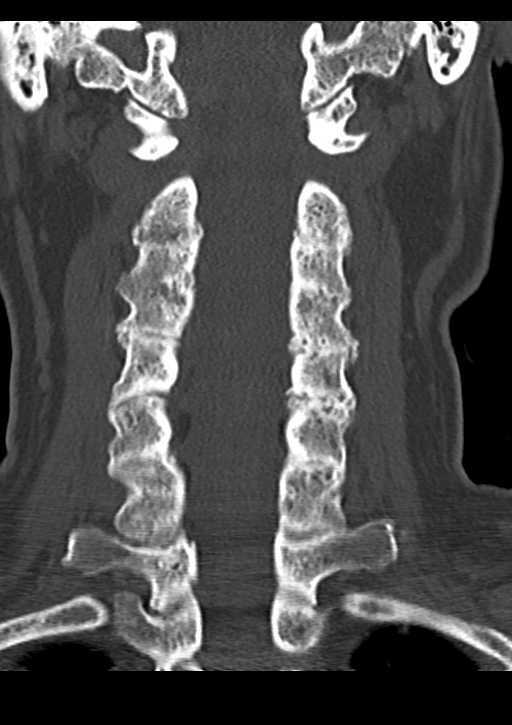
[im 23/39  bone]
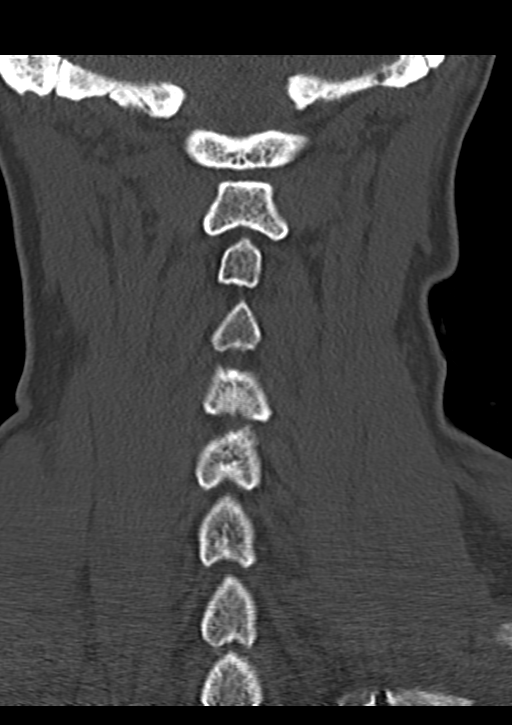

[Series 6: sagittals · sagittal · 0.28mm/px · 5 of 61 slices shown, 6 images]
[im 21/61  bone]
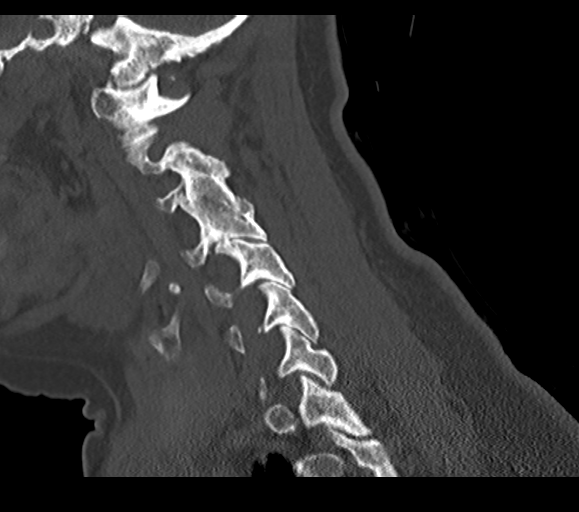
[im 26/61  bone]
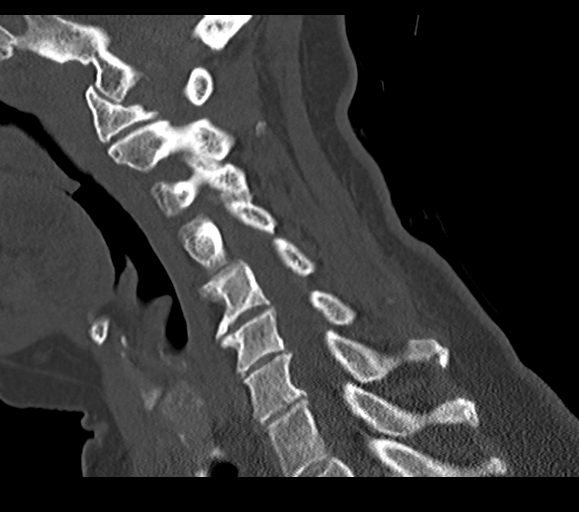
[im 31/61  soft-tissue]
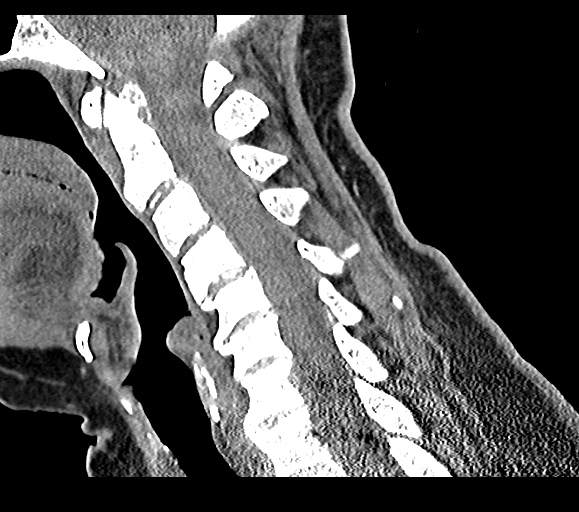
[im 31/61  bone]
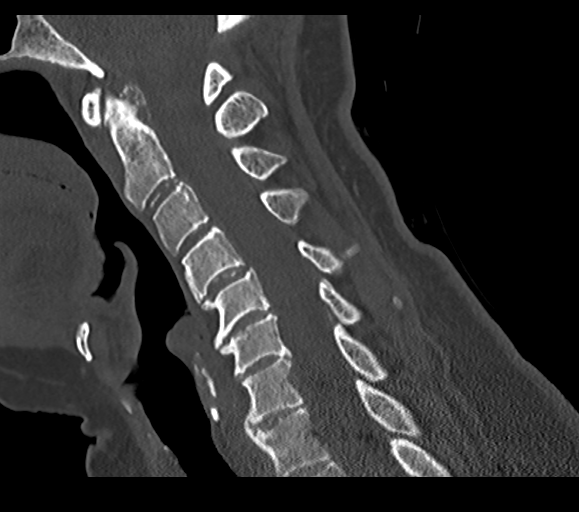
[im 36/61  bone]
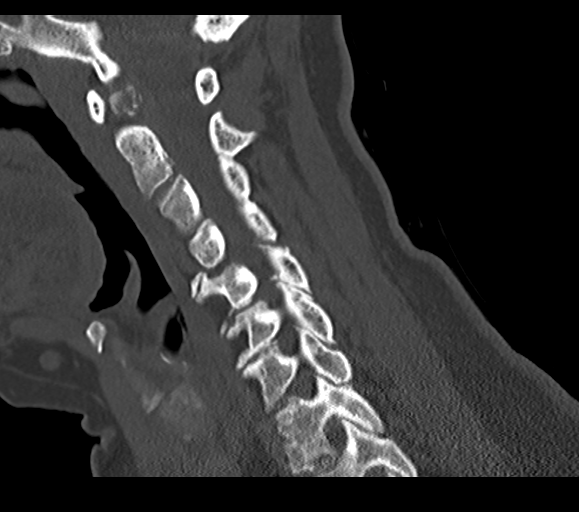
[im 41/61  bone]
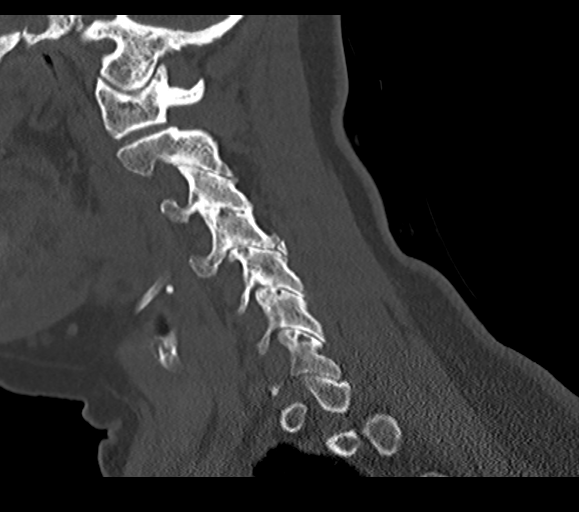

[13 of 33 positions shown; findings below may reference images not displayed]

FINDINGS: CT HEAD FINDINGS

Brain: No evidence of acute intracranial hemorrhage or extra-axial
collection.No evidence of mass lesion/concern mass effect.The
ventricles are normal in size.Scattered subcortical and
periventricular white matter hypodensities, nonspecific but likely
sequela of chronic small vessel ischemic disease.

Vascular: No hyperdense vessel or unexpected calcification.

Skull: Normal. Negative for fracture or focal lesion.

Sinuses/Orbits: No acute finding.

Other: Small laceration along the left eyebrow.

CT CERVICAL SPINE FINDINGS

Alignment: Trace degenerative anterolisthesis at C3-C4 and C4-C5.

Skull base and vertebrae: No acute fracture. No primary bone lesion
or focal pathologic process.

Soft tissues and spinal canal: No prevertebral fluid or swelling. No
visible canal hematoma.

Disc levels: There is multilevel degenerative disc disease, overall
mild to moderate, worst at C5-C6 and C6-C7 and C7-T1. There is
moderate multilevel facet arthropathy, with bony fusion at C2-C3 and
C3-C4.

Upper chest: Negative.

Other: None.
IMPRESSION: No acute intracranial abnormality. Unchanged mild sequela of chronic
small vessel ischemic disease.

No acute cervical spine fracture. Multilevel degenerative disc
disease and facet arthropathy.

## 2022-08-05 IMAGING — CR DG CHEST 2V
2 series · 2 of 2 positions shown · non-contrast
Comparison: September 03, 2020

CLINICAL DATA: Chest pain.

EXAM:
CHEST - 2 VIEW

[w chest pa]
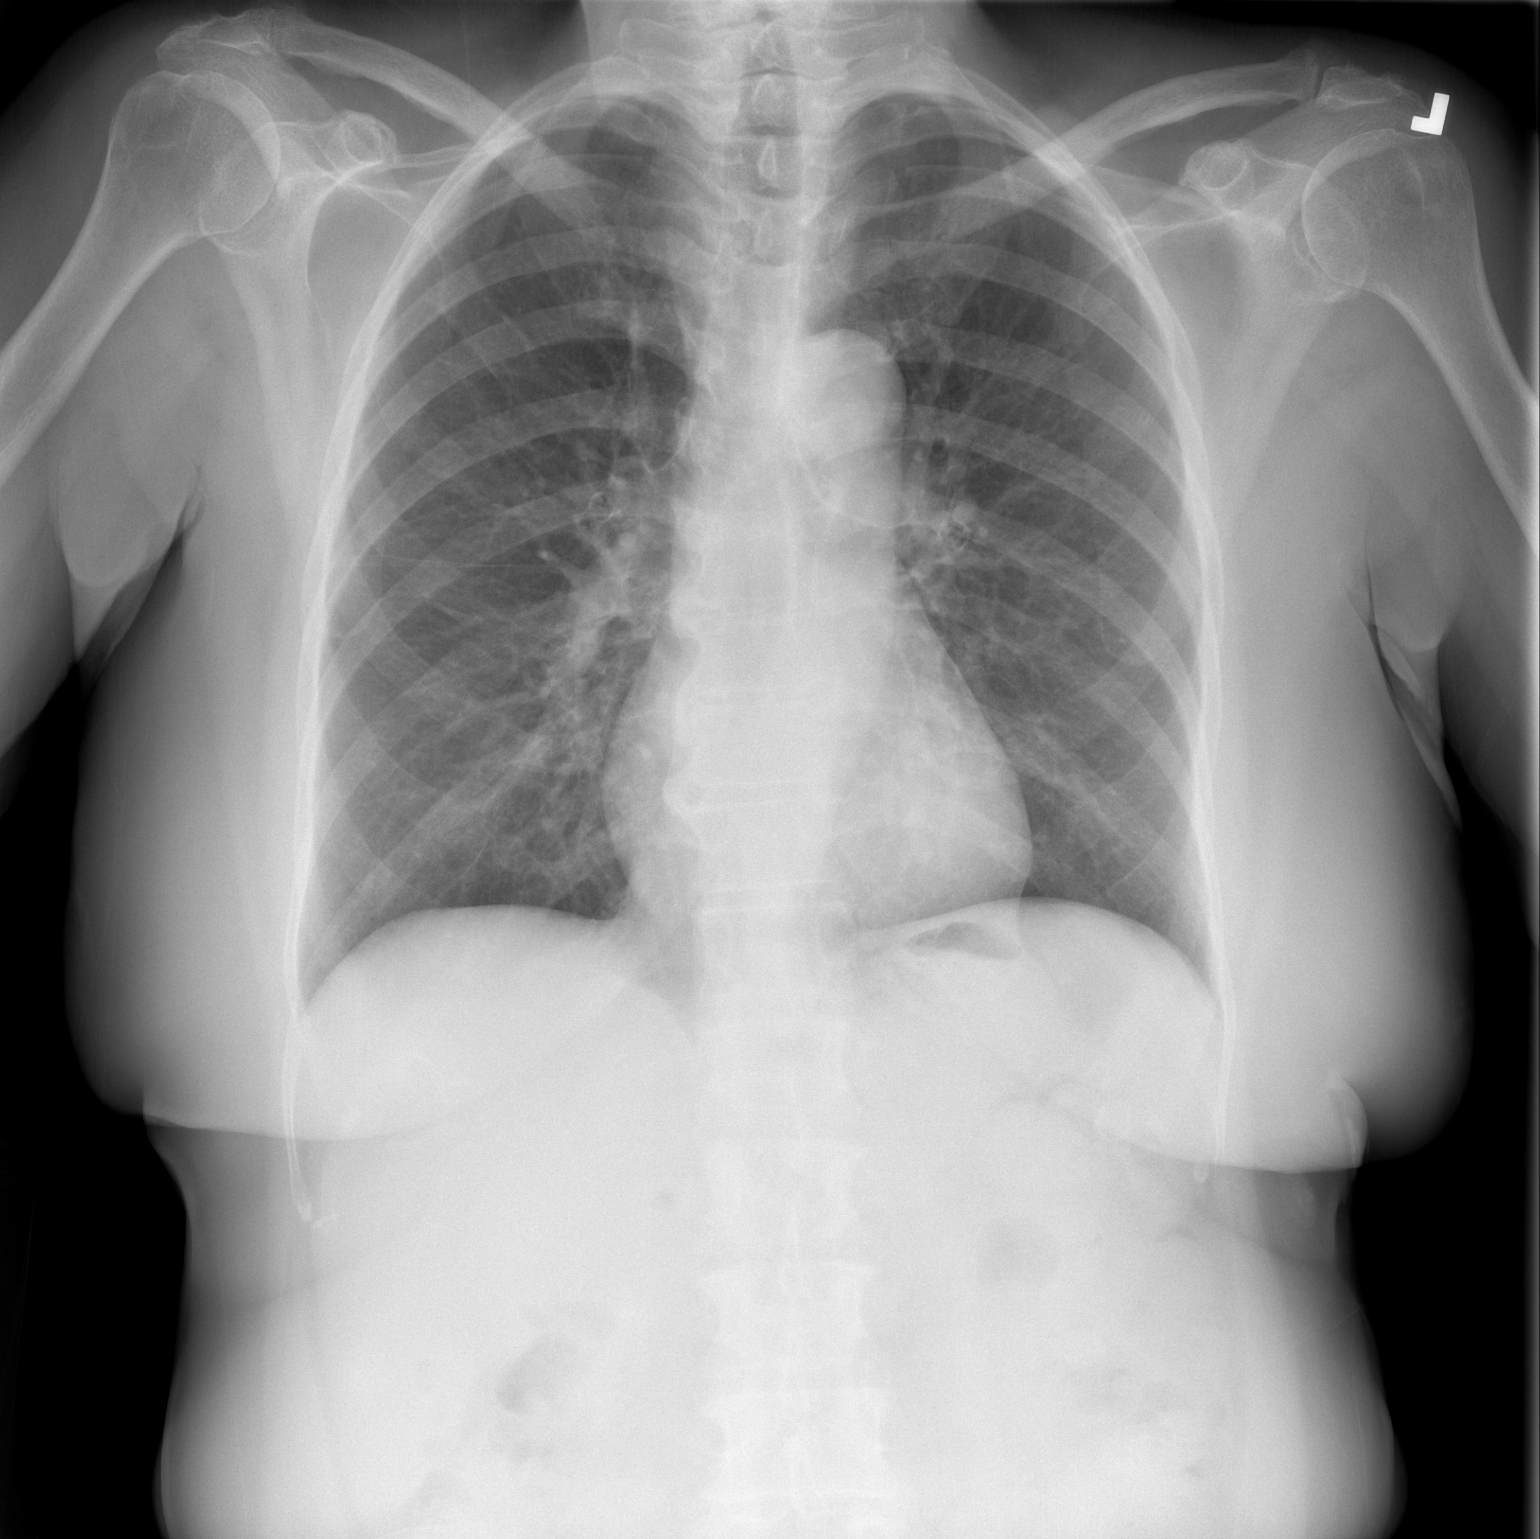

[w chest lat]
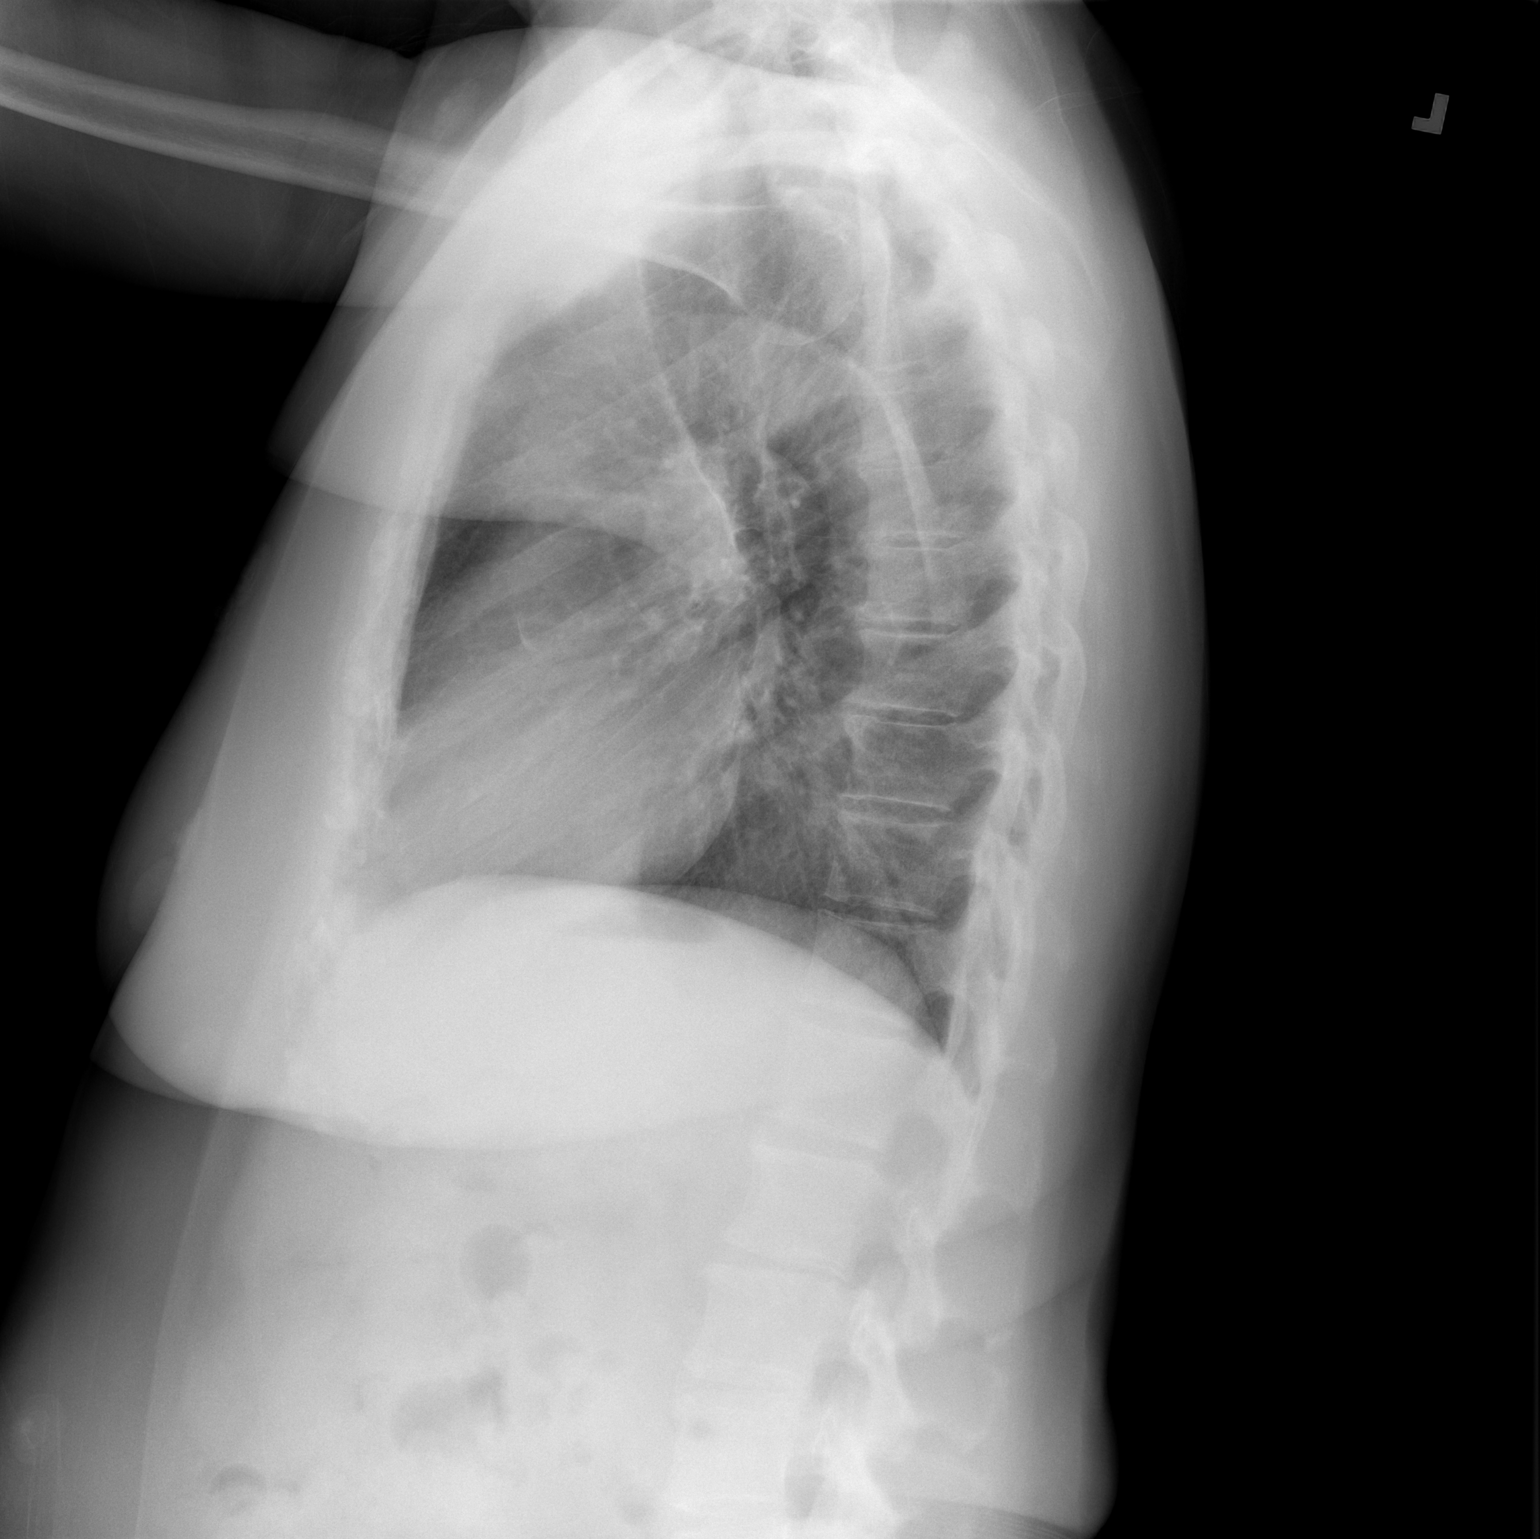

[2 of 2 positions shown; findings below may reference images not displayed]

FINDINGS: The heart size and mediastinal contours are within normal limits.
Both lungs are clear. The visualized skeletal structures are
unremarkable.
IMPRESSION: No active cardiopulmonary disease.

## 2022-09-07 ENCOUNTER — Emergency Department (HOSPITAL_BASED_OUTPATIENT_CLINIC_OR_DEPARTMENT_OTHER): Payer: Medicare Other

## 2022-09-07 ENCOUNTER — Other Ambulatory Visit: Payer: Self-pay

## 2022-09-07 ENCOUNTER — Encounter (HOSPITAL_BASED_OUTPATIENT_CLINIC_OR_DEPARTMENT_OTHER): Payer: Self-pay

## 2022-09-07 ENCOUNTER — Emergency Department (HOSPITAL_BASED_OUTPATIENT_CLINIC_OR_DEPARTMENT_OTHER)
Admission: EM | Admit: 2022-09-07 | Discharge: 2022-09-07 | Disposition: A | Discharge status: Home / Self Care | Payer: Medicare Other | Attending: Emergency Medicine | Admitting: Emergency Medicine

## 2022-09-07 DIAGNOSIS — Z7982 Long term (current) use of aspirin: Secondary | ICD-10-CM | POA: Insufficient documentation

## 2022-09-07 DIAGNOSIS — E119 Type 2 diabetes mellitus without complications: Secondary | ICD-10-CM | POA: Diagnosis not present

## 2022-09-07 DIAGNOSIS — Z79899 Other long term (current) drug therapy: Secondary | ICD-10-CM | POA: Insufficient documentation

## 2022-09-07 DIAGNOSIS — I10 Essential (primary) hypertension: Secondary | ICD-10-CM | POA: Insufficient documentation

## 2022-09-07 DIAGNOSIS — Z7984 Long term (current) use of oral hypoglycemic drugs: Secondary | ICD-10-CM | POA: Diagnosis not present

## 2022-09-07 DIAGNOSIS — R0789 Other chest pain: Secondary | ICD-10-CM | POA: Insufficient documentation

## 2022-09-07 DIAGNOSIS — I2510 Atherosclerotic heart disease of native coronary artery without angina pectoris: Secondary | ICD-10-CM | POA: Diagnosis not present

## 2022-09-07 LAB — CBC
HCT: 36.4 % (ref 36.0–46.0)
Hemoglobin: 12.4 g/dL (ref 12.0–15.0)
MCH: 29.4 pg (ref 26.0–34.0)
MCHC: 34.1 g/dL (ref 30.0–36.0)
MCV: 86.3 fL (ref 80.0–100.0)
Platelets: 151 10*3/uL (ref 150–400)
RBC: 4.22 MIL/uL (ref 3.87–5.11)
RDW: 13.2 % (ref 11.5–15.5)
WBC: 3.6 10*3/uL — ABNORMAL LOW (ref 4.0–10.5)
nRBC: 0 % (ref 0.0–0.2)

## 2022-09-07 LAB — TROPONIN I (HIGH SENSITIVITY)
Troponin I (High Sensitivity): 3 ng/L (ref <18)
Troponin I (High Sensitivity): 3 ng/L (ref <18)

## 2022-09-07 LAB — BASIC METABOLIC PANEL
Anion gap: 8 (ref 5–15)
BUN: 19 mg/dL (ref 8–23)
CO2: 25 mmol/L (ref 22–32)
Calcium: 8.8 mg/dL — ABNORMAL LOW (ref 8.9–10.3)
Chloride: 105 mmol/L (ref 98–111)
Creatinine, Ser: 0.76 mg/dL (ref 0.44–1.00)
GFR, Estimated: >60 mL/min (ref >60)
Glucose, Bld: 142 mg/dL — ABNORMAL HIGH (ref 70–99)
Potassium: 3.7 mmol/L (ref 3.5–5.1)
Sodium: 138 mmol/L (ref 135–145)

## 2022-09-07 NOTE — ED Triage Notes (Signed)
Pt c/o CP that began at 0200. Pt thinks it might have been due to [place] dressing. Pt points to central chest and states "it feels like someone hit me". No hx of MI. Pt reports this may have happened before, but otherwise this is new for her. Hx of HTN and DM.

## 2022-09-07 NOTE — ED Provider Notes (Signed)
[place] [facility]  Provider Note  CSN: [id number] Arrival date & time: 09/07/22 0404  History Chief Complaint  Patient presents with   Chest Pain    Destiny Castillo is a 82 y.o. female with history of HTN, DM but no known CAD reports she was woken from sleep around 2am with a mid sternal chest discomfort, described as an aching pain. Non-radiating, not associated with SOB, nausea or diaphoresis. Pain went away when she got up to go to the bathroom and returned briefly when she laid back down. She is currently pain free.    Home Medications Prior to Admission medications   Medication Sig Start Date End Date Taking? Authorizing Provider  amLODipine (NORVASC) 5 MG tablet Take 1 tablet by mouth daily. 09/21/16   [provider]  aspirin 81 MG EC tablet Take 1 tablet by mouth daily. 04/28/13   [provider]  celecoxib (CELEBREX) 200 MG capsule Take 1 tablet twice daily as needed for pain. 04/02/21   Molpus, John, MD  metFORMIN (GLUCOPHAGE-XR) 500 MG 24 hr tablet Take by mouth. 09/14/20   [provider]  metoprolol succinate (TOPROL-XL) 100 MG 24 hr tablet Take 100 mg by mouth 2 (two) times daily. 03/18/21   [provider]  omeprazole (PRILOSEC) 20 MG capsule Take 20 mg by mouth daily. 04/04/21   [provider]  pioglitazone (ACTOS) 15 MG tablet Take 15 mg by mouth daily. 04/13/21   [provider]  pravastatin (PRAVACHOL) 80 MG tablet Take 80 mg by mouth daily. 04/05/21   [provider]  telmisartan (MICARDIS) 80 MG tablet Take 80 mg by mouth daily. 04/04/21   [provider]     Allergies    Codeine   Review of Systems   Review of Systems Please see HPI for pertinent positives and negatives  Physical Exam BP (!) 167/72 (BP Location: Right Arm)   Pulse 64   Temp 97.9 F (36.6 C) (Oral)   Resp (!) 22   Ht 5\' 4"  (1.626 m)   Wt 65.8 kg   SpO2 100%   BMI 24.89 kg/m   Physical  Exam Vitals and nursing note reviewed.  Constitutional:      Appearance: Normal appearance.  HENT:     Head: Normocephalic and atraumatic.     Nose: Nose normal.     Mouth/Throat:     Mouth: Mucous membranes are moist.  Eyes:     Extraocular Movements: Extraocular movements intact.     Conjunctiva/sclera: Conjunctivae normal.  Cardiovascular:     Rate and Rhythm: Normal rate.  Pulmonary:     Effort: Pulmonary effort is normal.     Breath sounds: Normal breath sounds.  Abdominal:     General: Abdomen is flat.     Palpations: Abdomen is soft.     Tenderness: There is no abdominal tenderness.  Musculoskeletal:        General: No swelling. Normal range of motion.     Cervical back: Neck supple.  Skin:    General: Skin is warm and dry.  Neurological:     General: No focal deficit present.     Mental Status: She is alert.  Psychiatric:        Mood and Affect: Mood normal.     ED Results / Procedures / Treatments   EKG EKG Interpretation Date/Time:  Thursday September 07 2022 04:18:22 EDT Ventricular Rate:  71 PR Interval:  179 QRS Duration:  84 QT Interval:  405 QTC Calculation: 441 R Axis:   49  Text Interpretation: Sinus rhythm Normal ECG No significant change since last tracing Confirmed by Susy Frizzle [phone]) on 09/07/2022 4:23:09 AM  Procedures Procedures  Medications Ordered in the ED Medications - No data to display  Initial Impression and Plan  Patient with HTN/DM is here for atypical chest pain, currently resolved. Exam and vitals are reassuring. Will check labs, CXR and monitor in the ED.   ED Course   Clinical Course as of 09/07/22 0654  Thu Sep 07, 2022  0454 CBC is unremarkable [CS]  0514 I personally viewed the images from radiology studies and agree with radiologist interpretation:  CXR is clear.  [CS]  0514 BMP is normal.  [CS]  0518 Initial Trop is normal.  [CS]  0653 Repeat Trop remains normal. Patient has been asymptomatic throughout her ED  visit. HEART score is 3, low risk for MACE. Patient is comfortable going home. Recommend PCP follow up, RTED for any other concerns.   [CS]    Clinical Course User Index [CS] Pollyann Savoy, MD     MDM Rules/Calculators/A&P Medical Decision Making Given presenting complaint, I considered that admission might be necessary. After review of results from ED lab and/or imaging studies, admission to the hospital is not indicated at this time.    Problems Addressed: Atypical chest pain: acute illness or injury  Amount and/or Complexity of Data Reviewed Labs: ordered. Decision-making details documented in ED Course. Radiology: ordered and independent interpretation performed. Decision-making details documented in ED Course. ECG/medicine tests: ordered and independent interpretation performed. Decision-making details documented in ED Course.  Risk Decision regarding hospitalization.     Final Clinical Impression(s) / ED Diagnoses Final diagnoses:  Atypical chest pain    Rx / DC Orders ED Discharge Orders     None        Pollyann Savoy, MD 09/07/22 [phone]
